# Patient Record
Sex: Female | Born: 1995 | Race: White | Hispanic: No | Marital: Single | Smoking: Never smoker
Health system: Southern US, Community
[De-identification: ages and names within clinical notes are randomized; demographics above are authoritative.]

## PROBLEM LIST (undated history)

## (undated) DIAGNOSIS — J45909 Unspecified asthma, uncomplicated: Secondary | ICD-10-CM

## (undated) HISTORY — PX: TONSILLECTOMY: SUR1361

## (undated) HISTORY — PX: APPENDECTOMY: SHX54

---

## 2017-08-24 ENCOUNTER — Encounter (HOSPITAL_BASED_OUTPATIENT_CLINIC_OR_DEPARTMENT_OTHER): Payer: Self-pay | Admitting: *Deleted

## 2017-08-24 ENCOUNTER — Emergency Department (HOSPITAL_BASED_OUTPATIENT_CLINIC_OR_DEPARTMENT_OTHER)
Admission: EM | Admit: 2017-08-24 | Discharge: 2017-08-24 | Disposition: A | Discharge status: Home / Self Care | Payer: 59 | Attending: Emergency Medicine | Admitting: Emergency Medicine

## 2017-08-24 ENCOUNTER — Other Ambulatory Visit: Payer: Self-pay

## 2017-08-24 ENCOUNTER — Emergency Department (HOSPITAL_BASED_OUTPATIENT_CLINIC_OR_DEPARTMENT_OTHER): Payer: 59

## 2017-08-24 DIAGNOSIS — K659 Peritonitis, unspecified: Secondary | ICD-10-CM | POA: Diagnosis not present

## 2017-08-24 DIAGNOSIS — N83291 Other ovarian cyst, right side: Secondary | ICD-10-CM | POA: Insufficient documentation

## 2017-08-24 DIAGNOSIS — K6389 Other specified diseases of intestine: Secondary | ICD-10-CM

## 2017-08-24 DIAGNOSIS — J45909 Unspecified asthma, uncomplicated: Secondary | ICD-10-CM | POA: Insufficient documentation

## 2017-08-24 DIAGNOSIS — R109 Unspecified abdominal pain: Secondary | ICD-10-CM | POA: Diagnosis present

## 2017-08-24 DIAGNOSIS — N83209 Unspecified ovarian cyst, unspecified side: Secondary | ICD-10-CM

## 2017-08-24 DIAGNOSIS — K529 Noninfective gastroenteritis and colitis, unspecified: Secondary | ICD-10-CM

## 2017-08-24 HISTORY — DX: Unspecified asthma, uncomplicated: J45.909

## 2017-08-24 LAB — COMPREHENSIVE METABOLIC PANEL
ALK PHOS: 74 U/L (ref 38–126)
ALT: 19 U/L (ref 14–54)
AST: 17 U/L (ref 15–41)
Albumin: 4.1 g/dL (ref 3.5–5.0)
Anion gap: 8 (ref 5–15)
BILIRUBIN TOTAL: 0.3 mg/dL (ref 0.3–1.2)
BUN: 16 mg/dL (ref 6–20)
CALCIUM: 8.4 mg/dL — AB (ref 8.9–10.3)
CO2: 23 mmol/L (ref 22–32)
Chloride: 105 mmol/L (ref 101–111)
Creatinine, Ser: 0.56 mg/dL (ref 0.44–1.00)
GFR calc non Af Amer: >60 mL/min (ref >60)
GLUCOSE: 101 mg/dL — AB (ref 65–99)
Potassium: 3.7 mmol/L (ref 3.5–5.1)
Sodium: 136 mmol/L (ref 135–145)
TOTAL PROTEIN: 6.9 g/dL (ref 6.5–8.1)

## 2017-08-24 LAB — CBC WITH DIFFERENTIAL/PLATELET
Basophils Absolute: 0 10*3/uL (ref 0.0–0.1)
Basophils Relative: 0 %
Eosinophils Absolute: 0.1 10*3/uL (ref 0.0–0.7)
Eosinophils Relative: 1 %
HEMATOCRIT: 39.4 % (ref 36.0–46.0)
Hemoglobin: 13.2 g/dL (ref 12.0–15.0)
LYMPHS ABS: 2.9 10*3/uL (ref 0.7–4.0)
Lymphocytes Relative: 26 %
MCH: 29.8 pg (ref 26.0–34.0)
MCHC: 33.5 g/dL (ref 30.0–36.0)
MCV: 88.9 fL (ref 78.0–100.0)
MONOS PCT: 10 %
Monocytes Absolute: 1.2 10*3/uL — ABNORMAL HIGH (ref 0.1–1.0)
NEUTROS ABS: 7.1 10*3/uL (ref 1.7–7.7)
NEUTROS PCT: 63 %
Platelets: 197 10*3/uL (ref 150–400)
RBC: 4.43 MIL/uL (ref 3.87–5.11)
RDW: 12.8 % (ref 11.5–15.5)
WBC: 11.3 10*3/uL — ABNORMAL HIGH (ref 4.0–10.5)

## 2017-08-24 LAB — PREGNANCY, URINE: Preg Test, Ur: NEGATIVE

## 2017-08-24 LAB — URINALYSIS, ROUTINE W REFLEX MICROSCOPIC
Bilirubin Urine: NEGATIVE
GLUCOSE, UA: NEGATIVE mg/dL
Hgb urine dipstick: NEGATIVE
Ketones, ur: NEGATIVE mg/dL
LEUKOCYTES UA: NEGATIVE
Nitrite: NEGATIVE
PH: 8 (ref 5.0–8.0)
Protein, ur: NEGATIVE mg/dL
Specific Gravity, Urine: 1.015 (ref 1.005–1.030)

## 2017-08-24 LAB — LIPASE, BLOOD: Lipase: 30 U/L (ref 11–51)

## 2017-08-24 MED ORDER — NAPROXEN 500 MG PO TABS: 500.0000 mg | ORAL_TABLET | Freq: Two times a day (BID) | ORAL | 0 refills | Status: AC

## 2017-08-24 MED ORDER — IOPAMIDOL (ISOVUE-300) INJECTION 61%
100.0000 mL | Freq: Once | INTRAVENOUS | Status: AC | PRN
  Administered 2017-08-24: 100 mL via INTRAVENOUS

## 2017-08-24 NOTE — ED Triage Notes (Signed)
Pt c/o lower left abd  pain x 10 hrs also c/o nausea

## 2017-08-24 NOTE — Discharge Instructions (Signed)
Please read and follow all provided instructions.  Your diagnoses today include:  1. Epiploic appendagitis   2. Ruptured ovarian cyst     Tests performed today include:  Blood counts and electrolytes  Blood tests to check liver and kidney function  Blood tests to check pancreas function  Urine test to look for infection and pregnancy (in women)  CT scan -shows epiploic appendage otitis which is likely the cause of your pain.  This should get better in a few days.  Continue to take naproxen for pain and use a heating pad.  Your CT scan also shows a recent right-sided ovarian cyst.  Vital signs. See below for your results today.   Medications prescribed:   Naproxen - anti-inflammatory pain medication  Do not exceed 500mg  naproxen every 12 hours, take with food  You have been prescribed an anti-inflammatory medication or NSAID. Take with food. Take smallest effective dose for the shortest duration needed for your pain. Stop taking if you experience stomach pain or vomiting.   Take any prescribed medications only as directed.  Home care instructions:   Follow any educational materials contained in this packet.  Follow-up instructions: Please follow-up with your primary care provider in the next 3 days for further evaluation of your symptoms.    Return instructions:  SEEK IMMEDIATE MEDICAL ATTENTION IF:  The pain does not go away or becomes severe   A temperature above 101F develops   Repeated vomiting occurs (multiple episodes)   The pain becomes localized to portions of the abdomen. The right side could possibly be appendicitis. In an adult, the left lower portion of the abdomen could be colitis or diverticulitis.   Blood is being passed in stools or vomit (bright red or black tarry stools)   You develop chest pain, difficulty breathing, dizziness or fainting, or become confused, poorly responsive, or inconsolable (young children)  If you have any other emergent  concerns regarding your health  Additional Information: Abdominal (belly) pain can be caused by many things. Your caregiver performed an examination and possibly ordered blood/urine tests and imaging (CT scan, x-rays, ultrasound). Many cases can be observed and treated at home after initial evaluation in the emergency department. Even though you are being discharged home, abdominal pain can be unpredictable. Therefore, you need a repeated exam if your pain does not resolve, returns, or worsens. Most patients with abdominal pain don't have to be admitted to the hospital or have surgery, but serious problems like appendicitis and gallbladder attacks can start out as nonspecific pain. Many abdominal conditions cannot be diagnosed in one visit, so follow-up evaluations are very important.  Your vital signs today were: BP 115/63 (BP Location: Right Arm)    Pulse 60    Temp 98.1 F (36.7 C) (Oral)    Resp 18    Ht 5\' 3"  (1.6 m)    Wt 90.7 kg (200 lb)    LMP 07/24/2017    SpO2 100%    BMI 35.43 kg/m  If your blood pressure (bp) was elevated above 135/85 this visit, please have this repeated by your doctor within one month. --------------

## 2017-08-24 NOTE — ED Provider Notes (Signed)
[facility] EMERGENCY DEPARTMENT Provider Note   CSN: [id number] Arrival date & time: 08/24/17  1617     History   Chief Complaint Chief Complaint  Patient presents with  . Abdominal Pain    HPI Lauren Shaffer is a 22 y.o. female.  Patient with previous history of appendectomy presents the emergency department with left-sided abdominal pain for approximately the past 12 hours.  Patient awoke with the pain this morning.  Onset was acute.  Course is constant.  Pain does not radiate.  It is a dull ache in nature.  No associated fevers, nausea, vomiting, diarrhea.  No chest pain or shortness of breath.  No dysuria, hematuria, or increased frequency urgency.  No vaginal bleeding or discharge.  No treatments prior to arrival.  Patient has not had pain like this in the past.     Past Medical History:  Diagnosis Date  . Asthma     There are no active problems to display for this patient.   Past Surgical History:  Procedure Laterality Date  . APPENDECTOMY    . TONSILLECTOMY       OB History   None      Home Medications    Prior to Admission medications   Not on File    Family History History reviewed. No pertinent family history.  Social History Social History   Tobacco Use  . Smoking status: Never Smoker  . Smokeless tobacco: Never Used  Substance Use Topics  . Alcohol use: Never    Frequency: Never  . Drug use: Never     Allergies   Ciprofloxacin and Sulfur   Review of Systems Review of Systems  Constitutional: Negative for fever.  HENT: Negative for rhinorrhea and sore throat.   Eyes: Negative for redness.  Respiratory: Negative for cough.   Cardiovascular: Negative for chest pain.  Gastrointestinal: Positive for abdominal pain. Negative for diarrhea, nausea and vomiting.  Genitourinary: Negative for dysuria, frequency, hematuria, vaginal bleeding and vaginal discharge.  Musculoskeletal: Negative for myalgias.  Skin: Negative for  rash.  Neurological: Negative for headaches.     Physical Exam Updated Vital Signs BP 139/85   Pulse 71   Temp 98.1 F (36.7 C) (Oral)   Resp 18   Ht 5\' 3"  (1.6 m)   Wt 90.7 kg (200 lb)   LMP 07/24/2017   SpO2 100%   BMI 35.43 kg/m   Physical Exam  Constitutional: She appears well-developed and well-nourished.  HENT:  Head: Normocephalic and atraumatic.  Eyes: Conjunctivae are normal. Right eye exhibits no discharge. Left eye exhibits no discharge.  Neck: Normal range of motion. Neck supple.  Cardiovascular: Normal rate, regular rhythm and normal heart sounds.  Pulmonary/Chest: Effort normal and breath sounds normal.  Abdominal: Soft. Bowel sounds are normal. There is tenderness (Moderate left lateral tenderness at level of umbilicus, milder pain left upper and lower quadrants.  No focal pelvic pain.) in the left upper quadrant and left lower quadrant. There is no rebound, no guarding, no tenderness at McBurney's point and negative Murphy's sign.  Neurological: She is alert.  Skin: Skin is warm and dry.  Psychiatric: She has a normal mood and affect.  Nursing note and vitals reviewed.    ED Treatments / Results  Labs (all labs ordered are listed, but only abnormal results are displayed) Labs Reviewed  CBC WITH DIFFERENTIAL/PLATELET - Abnormal; Notable for the following components:      Result Value   WBC 11.3 (*)  Monocytes Absolute 1.2 (*)    All other components within normal limits  COMPREHENSIVE METABOLIC PANEL - Abnormal; Notable for the following components:   Glucose, Bld 101 (*)    Calcium 8.4 (*)    All other components within normal limits  URINALYSIS, ROUTINE W REFLEX MICROSCOPIC  PREGNANCY, URINE  LIPASE, BLOOD    EKG None  Radiology Ct Abdomen Pelvis W Contrast  Result Date: 08/24/2017 CLINICAL DATA:  Lower abdominal pain and nausea EXAM: CT ABDOMEN AND PELVIS WITH CONTRAST TECHNIQUE: Multidetector CT imaging of the abdomen and pelvis was  performed using the standard protocol following bolus administration of intravenous contrast. Oral contrast was also administered. CONTRAST:  ISOVUE-300 IOPAMIDOL (ISOVUE-300) INJECTION 61% COMPARISON:  None. FINDINGS: Lower chest: Lung bases are clear. Hepatobiliary: There is mild fatty infiltration near the fissure for the ligamentum teres. No focal liver lesions are evident. Gallbladder wall is not appreciably thickened. There is no biliary duct dilatation. Pancreas: There is no pancreatic mass or inflammatory focus. Spleen: No splenic lesions are evident. Adrenals/Urinary Tract: Adrenals bilaterally appear unremarkable. Kidneys bilaterally show no evident mass or hydronephrosis on either side. There is no evident renal or ureteral calculus on either side. Urinary bladder is midline with wall thickness within normal limits. Stomach/Bowel: There is an inflamed epiploic appendage in the distal most aspect of the descending colon near the junction with the ascending colon. There is localized mesenteric thickening in this area. There is slight thickening of the wall of the colon focally in this area of epiploic appendagitis. No abscess or perforation. No diverticular disease is evident in this area. No other inflammatory changes noted involving the bowel. No bowel obstruction. No free air or portal venous air. Vascular/Lymphatic: There is no abdominal aortic aneurysm. No vascular lesion evident. No adenopathy is appreciable in the abdomen or pelvis by size criteria. Occasional subcentimeter mesenteric lymph nodes are regarded as nonspecific. Reproductive: Uterus is in the midline. There is a cyst or dominant follicle arising from the right ovary measuring 2.0 x 1.7 cm. Fluid tracks from the right adnexa into the cul-de-sac region. Other: Appendix is not appreciable. There is no periappendiceal region inflammatory change. No abscess is noted in the abdomen or pelvis. There is no ascites beyond the fluid in the  cul-de-sac. Musculoskeletal: No blastic or lytic bone lesions are evident. There is no intramuscular or abdominal wall lesion. IMPRESSION: 1. Epiploic appendagitis in the distal descending colon near the junction with the sigmoid colon. There is an inflamed epiploic appendage in this area with wall and mesenteric thickening. No abscess or perforation. No diverticular disease evident in this area. 2. No bowel obstruction. No abscess in the abdomen or pelvis. No periappendiceal region inflammation evident. Appendix not appreciable. 3. Fluid tracks from the right ovary into the cul-de-sac consistent with recent ovarian cyst rupture. Small residual ovarian cyst noted in the right ovary region. 4.  No renal or ureteral calculus.  No hydronephrosis. Electronically Signed   By: Bretta Bang III M.D.   On: 08/24/2017 20:19    Procedures Procedures (including critical care time)  Medications Ordered in ED Medications  iopamidol (ISOVUE-300) 61 % injection 100 mL (100 mLs Intravenous Contrast Given 08/24/17 1945)     Initial Impression / Assessment and Plan / ED Course  I have reviewed the triage vital signs and the nursing notes.  Pertinent labs & imaging results that were available during my care of the patient were reviewed by me and considered in my medical decision  making (see chart for details).     Patient seen and examined. Work-up initiated.  Patient declines pain medication.  Will defer decision on imaging until lab work results.  Vital signs reviewed and are as follows: BP 139/85   Pulse 71   Temp 98.1 F (36.7 C) (Oral)   Resp 18   Ht 5\' 3"  (1.6 m)   Wt 90.7 kg (200 lb)   LMP 07/24/2017   SpO2 100%   BMI 35.43 kg/m   6:14 PM patient updated on lab results.  She continues to have moderate left-sided abdominal pain.  She winces with palpation and with movement.  We discussed performing a CT scan to rule out concerning abdominal etiology.  Patient is in agreement.  We will  proceed with imaging.  9:00 PM patient updated on results.  Imaging is consistent with epiploic appendage otitis.  Patient also has signs of a recent right-sided ruptured ovarian cyst.  No infection such as diverticulitis noted.  No free air.  Patient updated on results.  Discussed that this condition is likely self-limiting.  She will use NSAIDs and heating pad at home.  Work note given.  The patient was urged to return to the Emergency Department immediately with worsening of current symptoms, worsening abdominal pain, persistent vomiting, blood noted in stools, fever, or any other concerns. The patient verbalized understanding.    Final Clinical Impressions(s) / ED Diagnoses   Final diagnoses:  Epiploic appendagitis  Ruptured ovarian cyst   Patient with left-sided abdominal pain with mild leukocytosis.  CT scan reveals epiploic appendage otitis which is consistent with patient's presentation.  Patient also has fluid in the pelvis likely from a recently ruptured right-sided ovarian cyst.  She had some pain in this area several months ago but no current right lower quadrant pain.  Otherwise workup and CT imaging is reassuring.  Patient counseled on a typical course and she will continue to treat  at home.  Return instructions as above.  ED Discharge Orders        Ordered    naproxen (NAPROSYN) 500 MG tablet  2 times daily     08/24/17 2058       Renne CriglerGeiple, Kayela Humphres, PA-C 08/24/17 2102    Arby BarrettePfeiffer, Marcy, MD 08/28/17 [phone]

## 2018-05-14 IMAGING — CT CT ABD-PELV W/ CM
2 of 4 series · 15 of 46 positions shown, 17 images · IV contrast (APPLIED)
Comparison: None.

CLINICAL DATA: Lower abdominal pain and nausea

EXAM:
CT ABDOMEN AND PELVIS WITH CONTRAST
TECHNIQUE: Multidetector CT imaging of the abdomen and pelvis was performed
using the standard protocol following bolus administration of
intravenous contrast. Oral contrast was also administered.
CONTRAST:  100mL BL58PW-SAA IOPAMIDOL (BL58PW-SAA) INJECTION 61%

[Series 2: axial st · axial · 0.82mm/px · z∈[-494,-70]mm · 12 of 97 slices shown, 14 images]
[im 8/97  soft-tissue]
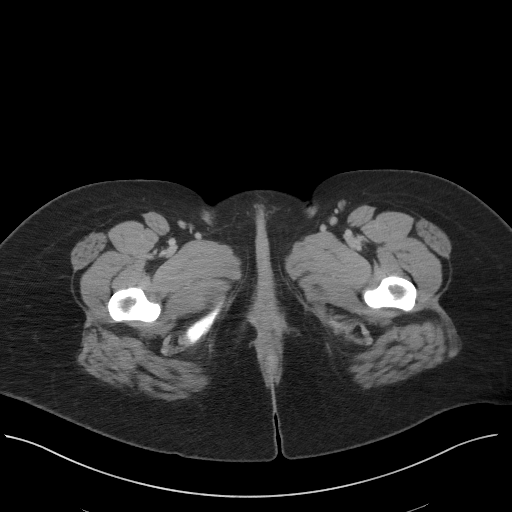
[im 8/97  bone]
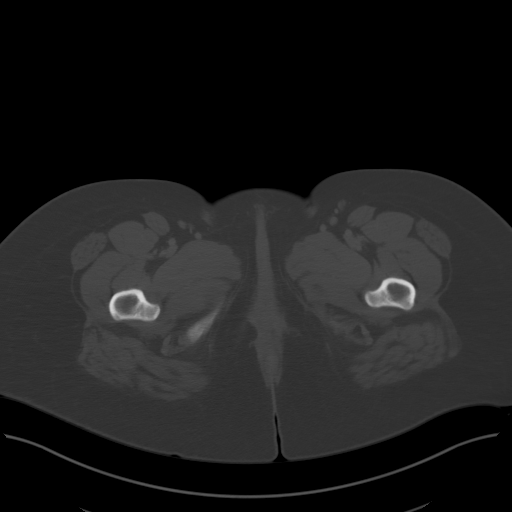
[im 16/97  soft-tissue]
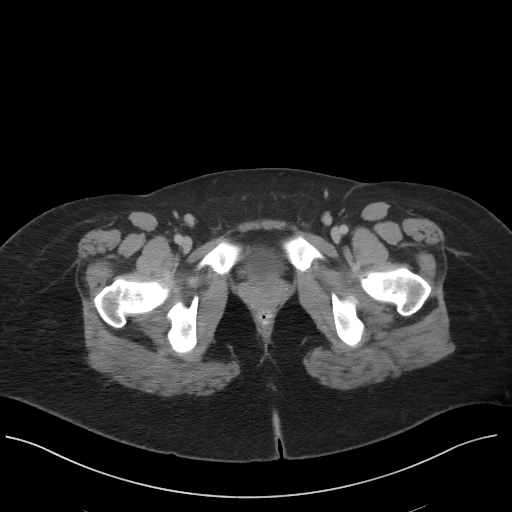
[im 24/97  soft-tissue]
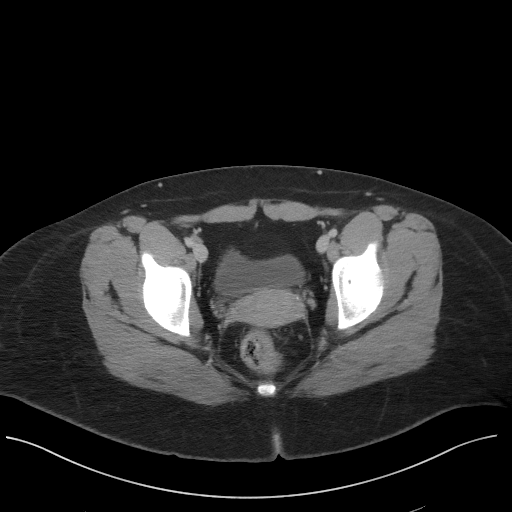
[im 31/97  soft-tissue]
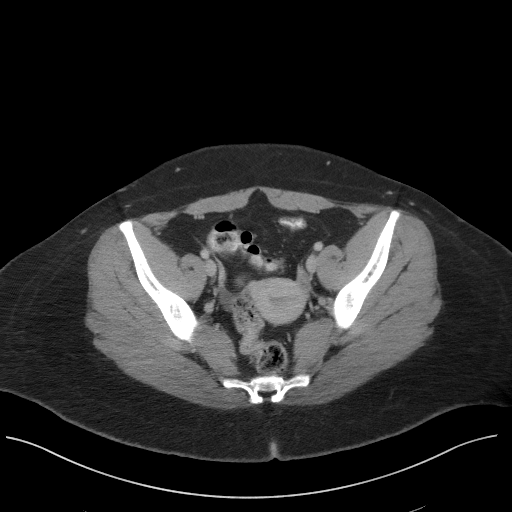
[im 39/97  soft-tissue]
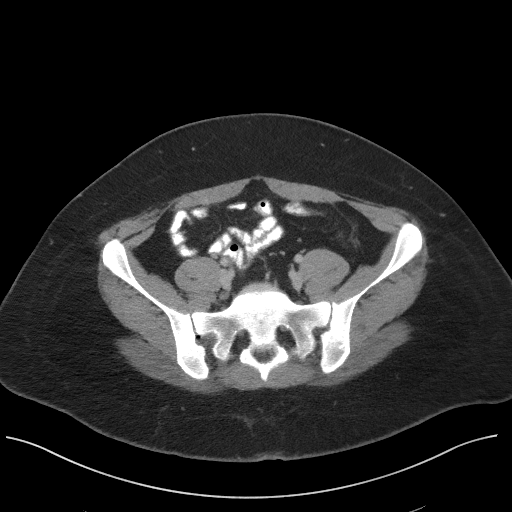
[im 47/97  soft-tissue]
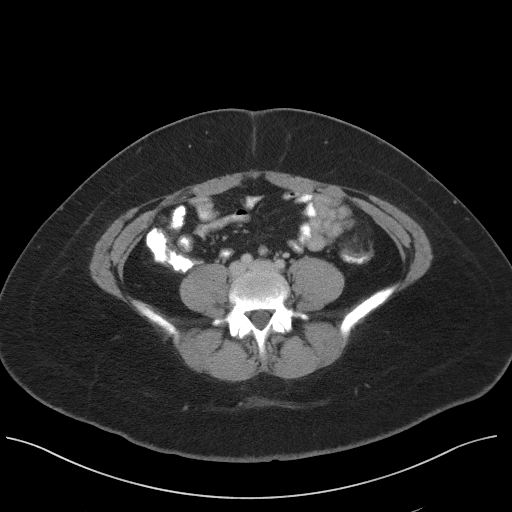
[im 54/97  soft-tissue]
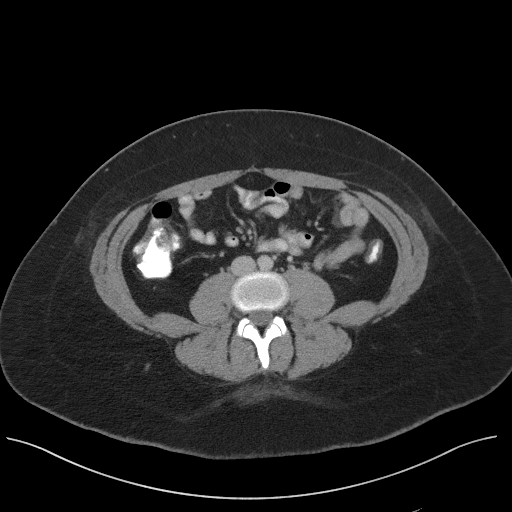
[im 62/97  soft-tissue]
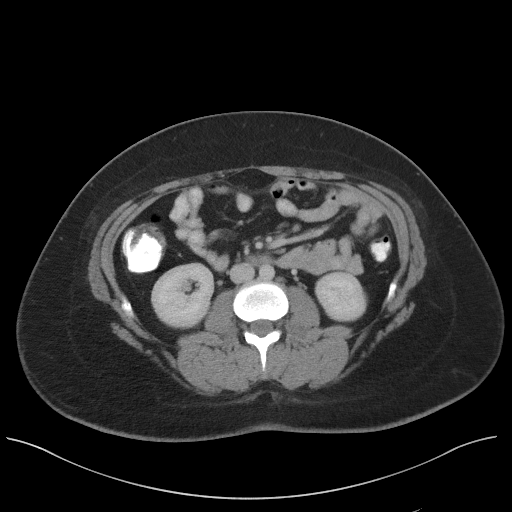
[im 70/97  soft-tissue]
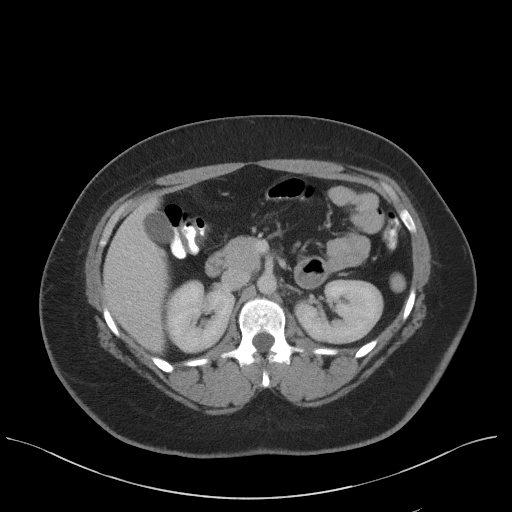
[im 70/97  bone]
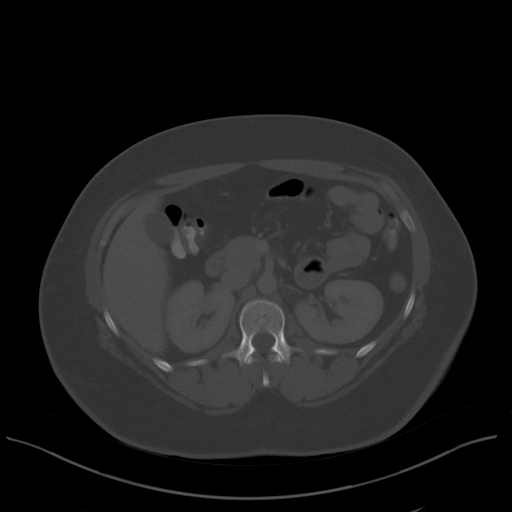
[im 77/97  soft-tissue]
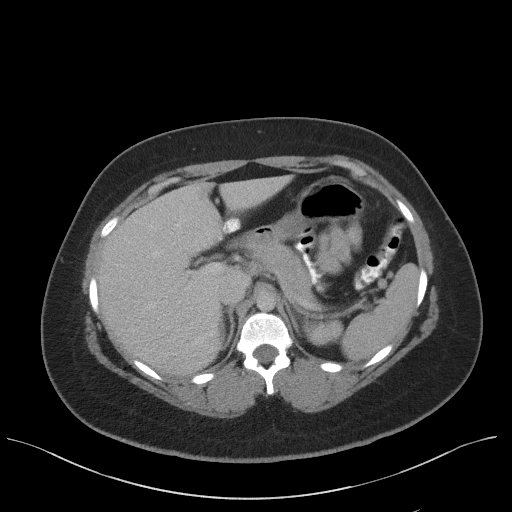
[im 85/97  soft-tissue]
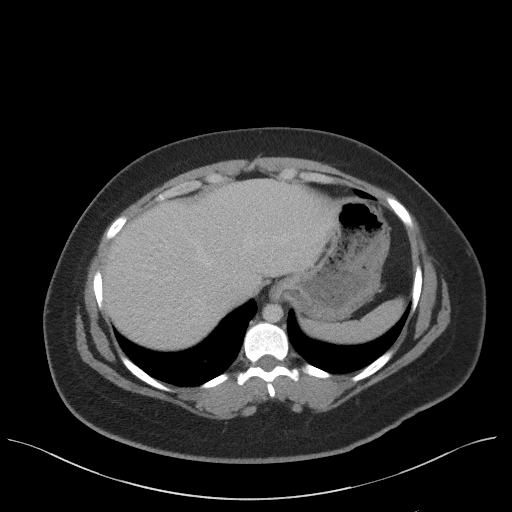
[im 93/97  soft-tissue]
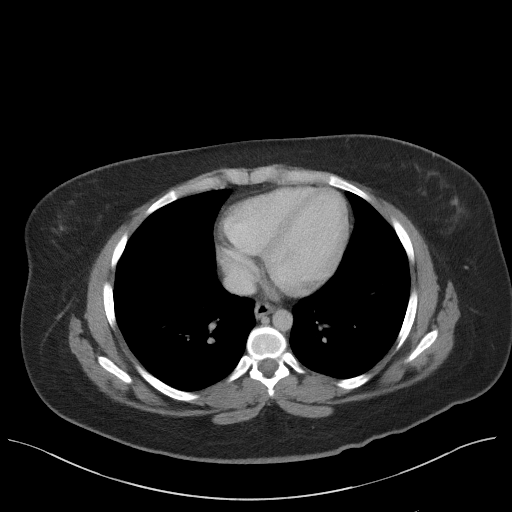

[Series 5: coronal st · coronal · 0.91mm/px · 3 of 85 slices shown]
[im 29/85  soft-tissue]
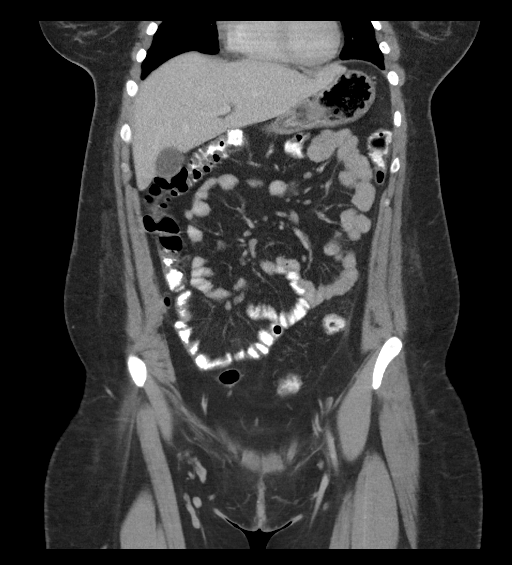
[im 38/85  soft-tissue]
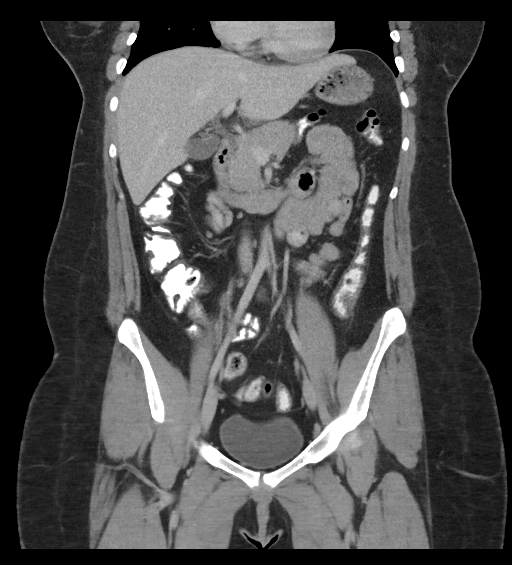
[im 47/85  soft-tissue]
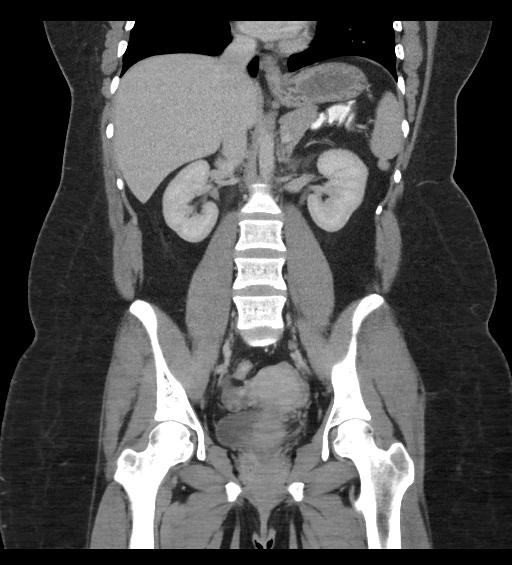

[15 of 46 positions shown; findings below may reference images not displayed]

FINDINGS: Lower chest: Lung bases are clear.

Hepatobiliary: There is mild fatty infiltration near the fissure for
the ligamentum teres. No focal liver lesions are evident.
Gallbladder wall is not appreciably thickened. There is no biliary
duct dilatation.

Pancreas: There is no pancreatic mass or inflammatory focus.

Spleen: No splenic lesions are evident.

Adrenals/Urinary Tract: Adrenals bilaterally appear unremarkable.
Kidneys bilaterally show no evident mass or hydronephrosis on either
side. There is no evident renal or ureteral calculus on either side.
Urinary bladder is midline with wall thickness within normal limits.

Stomach/Bowel: There is an inflamed epiploic appendage in the distal
most aspect of the descending colon near the junction with the
ascending colon. There is localized mesenteric thickening in this
area. There is slight thickening of the wall of the colon focally in
this area of epiploic appendagitis. No abscess or perforation. No
diverticular disease is evident in this area. No other inflammatory
changes noted involving the bowel. No bowel obstruction. No free air
or portal venous air.

Vascular/Lymphatic: There is no abdominal aortic aneurysm. No
vascular lesion evident. No adenopathy is appreciable in the abdomen
or pelvis by size criteria. Occasional subcentimeter mesenteric
lymph nodes are regarded as nonspecific.

Reproductive: Uterus is in the midline. There is a cyst or dominant
follicle arising from the right ovary measuring 2.0 x 1.7 cm. Fluid
tracks from the right adnexa into the cul-de-sac region.

Other: Appendix is not appreciable. There is no periappendiceal
region inflammatory change. No abscess is noted in the abdomen or
pelvis. There is no ascites beyond the fluid in the cul-de-sac.

Musculoskeletal: No blastic or lytic bone lesions are evident. There
is no intramuscular or abdominal wall lesion.
IMPRESSION: 1. Epiploic appendagitis in the distal descending colon near the
junction with the sigmoid colon. There is an inflamed epiploic
appendage in this area with wall and mesenteric thickening. No
abscess or perforation. No diverticular disease evident in this
area.

2. No bowel obstruction. No abscess in the abdomen or pelvis. No
periappendiceal region inflammation evident. Appendix not
appreciable.

3. Fluid tracks from the right ovary into the cul-de-sac consistent
with recent ovarian cyst rupture. Small residual ovarian cyst noted
in the right ovary region.

4.  No renal or ureteral calculus.  No hydronephrosis.
